# Patient Record
Sex: Female | Born: 1985 | Race: Asian | Hispanic: No | Marital: Married | Smoking: Never smoker
Health system: Southern US, Community
[De-identification: ages and names within clinical notes are randomized; demographics above are authoritative.]

## PROBLEM LIST (undated history)

## (undated) DIAGNOSIS — B1920 Unspecified viral hepatitis C without hepatic coma: Secondary | ICD-10-CM

---

## 2020-07-19 ENCOUNTER — Emergency Department: Payer: BLUE CROSS/BLUE SHIELD

## 2020-07-19 ENCOUNTER — Ambulatory Visit (INDEPENDENT_AMBULATORY_CARE_PROVIDER_SITE_OTHER)
Admission: EM | Admit: 2020-07-19 | Discharge: 2020-07-19 | Disposition: A | Discharge status: Home / Self Care | Payer: BLUE CROSS/BLUE SHIELD | Source: Home / Self Care | Attending: Emergency Medicine | Admitting: Emergency Medicine

## 2020-07-19 ENCOUNTER — Encounter: Payer: Self-pay | Admitting: Emergency Medicine

## 2020-07-19 ENCOUNTER — Ambulatory Visit (INDEPENDENT_AMBULATORY_CARE_PROVIDER_SITE_OTHER): Payer: BLUE CROSS/BLUE SHIELD

## 2020-07-19 ENCOUNTER — Other Ambulatory Visit: Payer: Self-pay

## 2020-07-19 DIAGNOSIS — R079 Chest pain, unspecified: Secondary | ICD-10-CM | POA: Insufficient documentation

## 2020-07-19 DIAGNOSIS — R0600 Dyspnea, unspecified: Secondary | ICD-10-CM | POA: Insufficient documentation

## 2020-07-19 DIAGNOSIS — D649 Anemia, unspecified: Secondary | ICD-10-CM | POA: Insufficient documentation

## 2020-07-19 LAB — BASIC METABOLIC PANEL
Anion gap: 9 (ref 5–15)
Anion gap: 11 (ref 5–15)
BUN: 11 mg/dL (ref 6–20)
BUN: 18 mg/dL (ref 6–20)
CO2: 24 mmol/L (ref 22–32)
CO2: 22 mmol/L (ref 22–32)
Calcium: 9.1 mg/dL (ref 8.9–10.3)
Calcium: 9.7 mg/dL (ref 8.9–10.3)
Chloride: 104 mmol/L (ref 98–111)
Chloride: 105 mmol/L (ref 98–111)
Creatinine, Ser: 0.58 mg/dL (ref 0.44–1.00)
Creatinine, Ser: 0.58 mg/dL (ref 0.44–1.00)
GFR calc Af Amer: >60 mL/min (ref >60)
GFR calc Af Amer: >60 mL/min (ref >60)
GFR calc non Af Amer: >60 mL/min (ref >60)
GFR calc non Af Amer: >60 mL/min (ref >60)
Glucose, Bld: 77 mg/dL (ref 70–99)
Glucose, Bld: 96 mg/dL (ref 70–99)
Potassium: 3.5 mmol/L (ref 3.5–5.1)
Potassium: 3.7 mmol/L (ref 3.5–5.1)
Sodium: 137 mmol/L (ref 135–145)
Sodium: 138 mmol/L (ref 135–145)

## 2020-07-19 LAB — CBC WITH DIFFERENTIAL/PLATELET
Abs Immature Granulocytes: 0.01 10*3/uL (ref 0.00–0.07)
Basophils Absolute: 0 10*3/uL (ref 0.0–0.1)
Basophils Relative: 0 %
Eosinophils Absolute: 0 10*3/uL (ref 0.0–0.5)
Eosinophils Relative: 1 %
HCT: 33.3 % — ABNORMAL LOW (ref 36.0–46.0)
Hemoglobin: 10.3 g/dL — ABNORMAL LOW (ref 12.0–15.0)
Immature Granulocytes: 0 %
Lymphocytes Relative: 33 %
Lymphs Abs: 1.3 10*3/uL (ref 0.7–4.0)
MCH: 22.9 pg — ABNORMAL LOW (ref 26.0–34.0)
MCHC: 30.9 g/dL (ref 30.0–36.0)
MCV: 74 fL — ABNORMAL LOW (ref 80.0–100.0)
Monocytes Absolute: 0.3 10*3/uL (ref 0.1–1.0)
Monocytes Relative: 7 %
Neutro Abs: 2.2 10*3/uL (ref 1.7–7.7)
Neutrophils Relative %: 59 %
Platelets: 223 10*3/uL (ref 150–400)
RBC: 4.5 MIL/uL (ref 3.87–5.11)
RDW: 15.5 % (ref 11.5–15.5)
WBC: 3.8 10*3/uL — ABNORMAL LOW (ref 4.0–10.5)
nRBC: 0 % (ref 0.0–0.2)

## 2020-07-19 LAB — CBC
HCT: 36.8 % (ref 36.0–46.0)
Hemoglobin: 11.2 g/dL — ABNORMAL LOW (ref 12.0–15.0)
MCH: 22.8 pg — ABNORMAL LOW (ref 26.0–34.0)
MCHC: 30.4 g/dL (ref 30.0–36.0)
MCV: 74.8 fL — ABNORMAL LOW (ref 80.0–100.0)
Platelets: 257 10*3/uL (ref 150–400)
RBC: 4.92 MIL/uL (ref 3.87–5.11)
RDW: 15.3 % (ref 11.5–15.5)
WBC: 6.6 10*3/uL (ref 4.0–10.5)
nRBC: 0 % (ref 0.0–0.2)

## 2020-07-19 LAB — TROPONIN I (HIGH SENSITIVITY)
Troponin I (High Sensitivity): <2 ng/L (ref <18)
Troponin I (High Sensitivity): <2 ng/L (ref <18)

## 2020-07-19 MED ORDER — FERROUS SULFATE 325 (65 FE) MG PO TABS
325.0000 mg | ORAL_TABLET | Freq: Every day | ORAL | 0 refills | Status: AC
Start: 2020-07-19 — End: ?

## 2020-07-19 NOTE — Discharge Instructions (Addendum)
Your chest x-ray, EKG and labs were reassuring.  Your hemoglobin is slightly low at 10.3.  However it is difficult to tell if this is new or not as there are no previous labs for comparison.  Start the iron pills.  This will build your blood back up and hopefully help with your shortness of breath.  Follow-up with a primary care provider as soon as you possibly can, see list below.  I have also ordered assistance for you to find a primary care provider go immediately to the emergency department if your chest pain returns, if you get worse, or for any other concerns.  Here is a list of primary care providers who are taking new patients:  Dr. Elizabeth Sauer [address] [place] [address] [phone]  [facility] [address] [place], [place] [address] [phone]  [facility] [place] [address] [place] [address]  [phone]  [facility] [address] [place], [place] [address] [phone]  [facility] [address] [place]  [phone] [place], [place] [address]  Here are clinics/ other resources who will see you if you do not have insurance. Some have certain criteria that you must meet. Call them and find out what they are:  [facility]: [address], [place], [place] [address] Phone: [phone] Hours: First and Third Saturdays of each Month, 9 a.m. - 1 p.m.  [facility]: [address], [address] [place], [place] [address] Phone: [phone] Hours: Tuesday, 4 p.m. - 8 p.m. Thursday, 1 p.m. - 8 p.m. Wednesday, 9 a.m. - [facility] [address], [place], [place] [address] Phone: [phone] Pharmacy Phone Number: [phone] Dental Phone Number: [phone] [facility] Insurance Help: [phone]  Dental Hours: Monday - Thursday, 8 a.m. - 6 p.m.  [facility] [address], [place], [place] [address] Phone: [phone] Pharmacy Phone Number:  [phone] [facility] Insurance Help: [phone]  [facility] [address] [place], [place] [address] Phone: [phone] Pharmacy Phone Number: [phone] [facility] Insurance Help: [phone]  [facility] [address] [place], [place] [address] Phone: [phone] [facility] Insurance Help: [phone]   [facility] [address] [place], [place] [address] Phone: [phone]  Go to www.goodrx.com to look up your medications. This will give you a list of where you can find your prescriptions at the most affordable prices. Or ask the pharmacist what the cash price is, or if they have any other discount programs available to help make your medication more affordable. This can be less expensive than what you would pay with insurance.

## 2020-07-19 NOTE — ED Triage Notes (Signed)
Pt to ED from home c/o SOB and chest pain since yesterday.  States SOB and chest pain with deep breaths, pain to mid chest. Denies pain today but still some SOB, denies cough.  Pt A&Ox4, chest rise even and unlabored, in NAD at this time.

## 2020-07-19 NOTE — ED Provider Notes (Signed)
HPI  SUBJECTIVE:  Tiffany Curry is a 34 y.o. female who presents with acute onset of sharp,  migratory, nonradiating right-sided chest pain present only with deep inspiration starting at 1900 last night. She reports constant shortness of breath described as "not being able to get a deep breath in" and some dizziness. She states that the right-sided chest pain has resolved as of this morning. She has had many similar episodes over the past few years, states that usually happens before going to bed. She states this episode is different because the chest pain/shortness of breath usually resolves on its own after several hours. There is no positional or exertional component to it. No fevers, cough, wheezing, trauma to the chest, change in physical activity. No recent illness palpitations, syncope, waterbrash. No burning chest pain, belching. No nausea, diaphoresis, no chest pressure or heaviness. No dyspnea on exertion, lower extremity edema, calf pain or swelling, hemoptysis, exogenous estrogen, surgery in the past 4 weeks, recent immobilization. She reports approximately 5 pound weight gain over the past month. No nocturia, PND, orthopnea. She tried rest. Symptoms are better with lying down flat, worse when she "feels hot". Past medical history negative for anxiety, GERD, CHF, PE, DVT, cancer, pulmonary disease, smoking, hypertension, myocarditis, Covid. She has a history of hepatitis B, gestational diabetes. Family history significant for a family member with an MI at age 72. She states that he had similar chest pains and shortness of breath and is worried that she may be having a heart attack. No family history of PE, DVT, lung cancer. PE: 7/26. Denies possibility of being pregnant. PMD: None. All history obtained through language line.    History reviewed. No pertinent past medical history.  History reviewed. No pertinent surgical history.  History reviewed. No pertinent family history.  Social History    Tobacco Use  . Smoking status: Never Smoker  . Smokeless tobacco: Never Used  Vaping Use  . Vaping Use: Never used  Substance Use Topics  . Alcohol use: Never  . Drug use: Never    No current facility-administered medications for this encounter.  Current Outpatient Medications:  .  ferrous sulfate 325 (65 FE) MG tablet, Take 1 tablet (325 mg total) by mouth daily., Disp: 30 tablet, Rfl: 0  No Known Allergies   ROS  As noted in HPI.   Physical Exam  BP (!) 94/57 (BP Location: Right Arm)   Pulse 90   Temp (!) 97.4 F (36.3 C) (Oral)   Resp 18   Ht 5' 0.24" (1.53 m)   LMP 06/27/2020   SpO2 100%   Constitutional: Well developed, well nourished, appears anxious Eyes: PERRL, EOMI, conjunctiva normal bilaterally HENT: Normocephalic, atraumatic,mucus membranes moist Respiratory: Clear to auscultation bilaterally, no rales, no wheezing, no rhonchi.  No chest wall tenderness Cardiovascular: Normal rate and rhythm, no murmurs, no gallops, no rubs.  No JVD GI: Soft, nondistended, normal bowel sounds, nontender, no rebound, no guarding.  No hepatomegaly skin: No rash, skin intact Musculoskeletal: Calves symmetric, nontender, no edema, Neurologic: Alert & oriented x 3, CN III-XII grossly intact, no motor deficits, sensation grossly intact Psychiatric: Speech and behavior appropriate   ED Course   Medications - No data to display  Orders Placed This Encounter  Procedures  . DG Chest 2 View    Standing Status:   Standing    Number of Occurrences:   1    Order Specific Question:   Reason for Exam (SYMPTOM  OR DIAGNOSIS REQUIRED)  Answer:   SOB  . CBC with Differential    Standing Status:   Standing    Number of Occurrences:   1  . Basic metabolic panel    Standing Status:   Standing    Number of Occurrences:   1  . ED EKG    Standing Status:   Standing    Number of Occurrences:   1    Order Specific Question:   Reason for Exam    Answer:   Shortness of breath    Results for orders placed or performed during the hospital encounter of 07/19/20 (from the past 24 hour(s))  CBC with Differential     Status: Abnormal   Collection Time: 07/19/20  1:35 PM  Result Value Ref Range   WBC 3.8 (L) 4.0 - 10.5 K/uL   RBC 4.50 3.87 - 5.11 MIL/uL   Hemoglobin 10.3 (L) 12.0 - 15.0 g/dL   HCT 98.3 (L) 36 - 46 %   MCV 74.0 (L) 80.0 - 100.0 fL   MCH 22.9 (L) 26.0 - 34.0 pg   MCHC 30.9 30.0 - 36.0 g/dL   RDW 38.2 50.5 - 39.7 %   Platelets 223 150 - 400 K/uL   nRBC 0.0 0.0 - 0.2 %   Neutrophils Relative % 59 %   Neutro Abs 2.2 1.7 - 7.7 K/uL   Lymphocytes Relative 33 %   Lymphs Abs 1.3 0.7 - 4.0 K/uL   Monocytes Relative 7 %   Monocytes Absolute 0.3 0 - 1 K/uL   Eosinophils Relative 1 %   Eosinophils Absolute 0.0 0 - 0 K/uL   Basophils Relative 0 %   Basophils Absolute 0.0 0 - 0 K/uL   Immature Granulocytes 0 %   Abs Immature Granulocytes 0.01 0.00 - 0.07 K/uL  Basic metabolic panel     Status: None   Collection Time: 07/19/20  1:35 PM  Result Value Ref Range   Sodium 137 135 - 145 mmol/L   Potassium 3.5 3.5 - 5.1 mmol/L   Chloride 104 98 - 111 mmol/L   CO2 24 22 - 32 mmol/L   Glucose, Bld 77 70 - 99 mg/dL   BUN 11 6 - 20 mg/dL   Creatinine, Ser 6.73 0.44 - 1.00 mg/dL   Calcium 9.1 8.9 - 41.9 mg/dL   GFR calc non Af Amer >60 >60 mL/min   GFR calc Af Amer >60 >60 mL/min   Anion gap 9 5 - 15  Troponin I (High Sensitivity)     Status: None   Collection Time: 07/19/20  1:35 PM  Result Value Ref Range   Troponin I (High Sensitivity) <2 <18 ng/L   DG Chest 2 View  Result Date: 07/19/2020 CLINICAL DATA:  Short of breath EXAM: CHEST - 2 VIEW COMPARISON:  None. FINDINGS: The heart size and mediastinal contours are within normal limits. Both lungs are clear. The visualized skeletal structures are unremarkable. IMPRESSION: No active cardiopulmonary disease. Electronically Signed   By: Marlan Palau M.D.   On: 07/19/2020 14:13    ED Clinical  Impression  1. Dyspnea, unspecified type   2. Anemia, unspecified type   3. Chest pain, unspecified type      ED Assessment/Plan  In the differential is ACS, pneumonia, pneumothorax, myocarditis, pericarditis, anemia.  Patient is PERC negative, deferring D-dimer.  Think PE less likely in the absence of tachycardia, hypoxia. Anxiety also in the differential.  CBC, BMP, troponin, chest x-ray.    EKG: Normal sinus  rhythm, rate 77. Normal axis, normal intervals. No hypertrophy. No previous EKG for comparison  Reviewed imaging independently.  Normal.  No pneumothorax, effusion.  See radiology report for full details.  She is anemic at 10.3, no previous labs for comparison.  Do not know if this is acute or not.  troponin negative, BMP normal.  Given the negative troponin, reassuring EKG and chest x-ray, unsure as to the cause of her symptoms, suspect anxiety.  Anemia in the differential.  We will start her on iron pills.  Will provide primary care list for her to follow-up with.  Will also order assistance with obtaining primary care.  Strict ER return precautions given.  Spent 25 minutes obtaining history and performing physical on patient. Spent an additional 25 minutes using the language line explaining findings, medical decision-making, treatment plan and plan for follow-up with patient. Answered all questions.  Discussed labs, imaging, MDM, treatment plan, and plan for follow-up with patient Discussed sn/sx that should prompt return to the ED. patient agrees with plan.   Meds ordered this encounter  Medications  . ferrous sulfate 325 (65 FE) MG tablet    Sig: Take 1 tablet (325 mg total) by mouth daily.    Dispense:  30 tablet    Refill:  0    *This clinic note was created using Scientist, clinical (histocompatibility and immunogenetics). Therefore, there may be occasional mistakes despite careful proofreading.  ?   Domenick Gong, MD 07/19/20 [phone]

## 2020-07-19 NOTE — ED Triage Notes (Signed)
Patient c/o pain when taking a deep breath that started yesterday. She states she has had this before and it resolved but this hasn't gone away since yesterday.

## 2020-07-20 ENCOUNTER — Emergency Department
Admission: EM | Admit: 2020-07-20 | Discharge: 2020-07-20 | Disposition: A | Discharge status: Home / Self Care | Payer: BLUE CROSS/BLUE SHIELD | Attending: Emergency Medicine | Admitting: Emergency Medicine

## 2020-07-20 DIAGNOSIS — R079 Chest pain, unspecified: Secondary | ICD-10-CM

## 2020-07-20 HISTORY — DX: Unspecified viral hepatitis C without hepatic coma: B19.20

## 2020-07-20 LAB — HEPATIC FUNCTION PANEL
ALT: 16 U/L (ref 0–44)
AST: 22 U/L (ref 15–41)
Albumin: 5.1 g/dL — ABNORMAL HIGH (ref 3.5–5.0)
Alkaline Phosphatase: 40 U/L (ref 38–126)
Bilirubin, Direct: <0.1 mg/dL (ref 0.0–0.2)
Total Bilirubin: 0.7 mg/dL (ref 0.3–1.2)
Total Protein: 8.8 g/dL — ABNORMAL HIGH (ref 6.5–8.1)

## 2020-07-20 LAB — LIPASE, BLOOD: Lipase: 36 U/L (ref 11–51)

## 2020-07-20 LAB — FIBRIN DERIVATIVES D-DIMER (ARMC ONLY): Fibrin derivatives D-dimer (ARMC): 217.54 ng/mL (FEU) (ref 0.00–499.00)

## 2020-07-20 LAB — TROPONIN I (HIGH SENSITIVITY): Troponin I (High Sensitivity): <2 ng/L (ref <18)

## 2020-07-20 NOTE — Discharge Instructions (Addendum)
Please return to the ED with any worsening symptoms, especially in combination with fevers, passing out or throwing up.

## 2020-07-20 NOTE — ED Notes (Signed)
This RN attempted IV access, no success.

## 2020-07-20 NOTE — ED Notes (Signed)
IV team at bedside at this time. 

## 2020-07-20 NOTE — ED Provider Notes (Signed)
[facility] Emergency Department Provider Note  ____________________________________________   First MD Initiated Contact with Patient 07/20/20 [phone]     (approximate)  I have reviewed the triage vital signs and the nursing notes.   HISTORY  Chief Complaint Shortness of Breath and Chest Pain    HPI Tiffany Curry is a 34 y.o. female with history of hepatitis C and no other chronic medical conditions presents to the emergency department secondary to acute onset of intermittent nonradiating right upper chest pain which patient states began 7 PM on 07/18/2020.  Patient states that the pain is worse with deep inspiration.  Patient states that she is experienced this in the past and that it usually resolves on its own however this continues to recur.  Patient was seen by urgent care on 07/19/2020 where laboratory data revealed a normal troponin, normal EKG chest x-ray.  Patient states that she returned tonight secondary to return of the chest discomfort that is worse with deep inspiration as well as bilateral hand and facial numbness.  Patient states that she was breathing rapidly at the time that the numbness occurred.  Patient states that numbness is since resolved.  Patient denies any headache no weakness or visual changes.        Past Medical History:  Diagnosis Date  . Hepatitis C     There are no problems to display for this patient.   No past surgical history on file.  Prior to Admission medications   Medication Sig Start Date End Date Taking? Authorizing Provider  ferrous sulfate 325 (65 FE) MG tablet Take 1 tablet (325 mg total) by mouth daily. 07/19/20   Domenick Gong, MD    Allergies Patient has no known allergies.  No family history on file.  Social History Social History   Tobacco Use  . Smoking status: Never Smoker  . Smokeless tobacco: Never Used  Vaping Use  . Vaping Use: Never used  Substance Use Topics  . Alcohol use: Never  .  Drug use: Never    Review of Systems Constitutional: No fever/chills Eyes: No visual changes. ENT: No sore throat. Cardiovascular: Positive for chest pain. Respiratory: Denies shortness of breath. Gastrointestinal: No abdominal pain.  No nausea, no vomiting.  No diarrhea.  No constipation. Genitourinary: Negative for dysuria. Musculoskeletal: Negative for neck pain.  Negative for back pain. Integumentary: Negative for rash. Neurological: Negative for headaches, focal weakness.  Positive for bilateral hand and facial numbness now resolved  ____________________________________________   PHYSICAL EXAM:  VITAL SIGNS: ED Triage Vitals  Enc Vitals Group     BP 07/19/20 2059 102/71     Pulse Rate 07/19/20 2059 86     Resp 07/19/20 2059 16     Temp 07/19/20 2059 98.6 F (37 C)     Temp Source 07/19/20 2059 Oral     SpO2 07/19/20 2059 100 %     Weight 07/19/20 2057 68 kg (150 lb)     Height 07/19/20 2057 1.52 m (4' 11.84")     Head Circumference --      Peak Flow --      Pain Score 07/19/20 2056 1     Pain Loc --      Pain Edu? --      Excl. in GC? --     Constitutional: Alert and oriented.  Eyes: Conjunctivae are normal.  Head: Atraumatic. Mouth/Throat: Patient is wearing a mask. Neck: No stridor.  No meningeal signs.   Cardiovascular: Normal rate,  regular rhythm. Good peripheral circulation. Grossly normal heart sounds. Respiratory: Normal respiratory effort.  No retractions. Gastrointestinal: Soft and nontender. No distention.  Musculoskeletal: No lower extremity tenderness nor edema. No gross deformities of extremities. Neurologic:  Normal speech and language. No gross focal neurologic deficits are appreciated.  Skin:  Skin is warm, dry and intact. Psychiatric: Mood and affect are normal. Speech and behavior are normal.  ____________________________________________   LABS (all labs ordered are listed, but only abnormal results are displayed)  Labs Reviewed  CBC -  Abnormal; Notable for the following components:      Result Value   Hemoglobin 11.2 (*)    MCV 74.8 (*)    MCH 22.8 (*)    All other components within normal limits  HEPATIC FUNCTION PANEL - Abnormal; Notable for the following components:   Total Protein 8.8 (*)    Albumin 5.1 (*)    All other components within normal limits  BASIC METABOLIC PANEL  LIPASE, BLOOD  FIBRIN DERIVATIVES D-DIMER ([facility] ONLY)  POC URINE PREG, ED  TROPONIN I (HIGH SENSITIVITY)  TROPONIN I (HIGH SENSITIVITY)   ____________________________________________  EKG  ED ECG REPORT I, Terrell N Emonie Espericueta, the attending physician, personally viewed and interpreted this ECG.   Date: 07/20/2020  EKG Time: 9:05 PM  Rate: 85  Rhythm: Normal sinus rhythm  Axis: Normal  Intervals: Normal  ST&T Change: None  ____________________________________________  RADIOLOGY I, Sterling N Kameryn Tisdel, personally viewed and evaluated these images (plain radiographs) as part of my medical decision making, as well as reviewing the written report by the radiologist.  ED MD interpretation: No evidence of acute cardiopulmonary disease on chest x-ray per radiologist.  Official radiology report(s): DG Chest 2 View  Result Date: 07/19/2020 CLINICAL DATA:  Chest pain. Additional history provided: Patient reports shortness of breath and chest pain since yesterday, reports shortness of breath and chest pain when taking deep breaths, pain in mid chest. EXAM: CHEST - 2 VIEW COMPARISON:  Chest radiographs performed earlier the same day 07/19/2020. FINDINGS: Heart size within normal limits. No appreciable airspace consolidation or pulmonary edema. No evidence of pleural effusion or pneumothorax. No acute bony abnormality identified. Thoracic dextrocurvature. IMPRESSION: No evidence of active cardiopulmonary disease. Thoracic dextrocurvature. Electronically Signed   By: Jackey Loge DO   On: 07/19/2020 21:26   DG Chest 2 View  Result Date:  07/19/2020 CLINICAL DATA:  Short of breath EXAM: CHEST - 2 VIEW COMPARISON:  None. FINDINGS: The heart size and mediastinal contours are within normal limits. Both lungs are clear. The visualized skeletal structures are unremarkable. IMPRESSION: No active cardiopulmonary disease. Electronically Signed   By: Marlan Palau M.D.   On: 07/19/2020 14:13      Procedures   ____________________________________________   INITIAL IMPRESSION / MDM / ASSESSMENT AND PLAN / ED COURSE  As part of my medical decision making, I reviewed the following data within the electronic MEDICAL RECORD NUMBER  34 year old female presented with above-stated history and physical exam a differential diagnosis including but not limited to ACS, low risk probability of pulmonary emboli, hyperventilation.  EKG revealed no evidence of ischemia/infarction or arrhythmia.  Laboratory data revealed negative high-sensitivity troponin x3.  Plan to obtain D-dimer and if negative patient will be discharged home with cardiology outpatient follow-up.  Patient's care transferred to Dr. Katrinka Blazing ____________________________________________  FINAL CLINICAL IMPRESSION(S) / ED DIAGNOSES  Final diagnoses:  Chest pain, unspecified type     MEDICATIONS GIVEN DURING THIS VISIT:  Medications - No data  to display   ED Discharge Orders    None      *Please note:  Ivori Storr was evaluated in Emergency Department on 07/20/2020 for the symptoms described in the history of present illness. She was evaluated in the context of the global COVID-19 pandemic, which necessitated consideration that the patient might be at risk for infection with the SARS-CoV-2 virus that causes COVID-19. Institutional protocols and algorithms that pertain to the evaluation of patients at risk for COVID-19 are in a state of rapid change based on information released by regulatory bodies including the CDC and federal and state organizations. These policies and algorithms were  followed during the patient's care in the ED.  Some ED evaluations and interventions may be delayed as a result of limited staffing during and after the pandemic.*  Note:  This document was prepared using Dragon voice recognition software and may include unintentional dictation errors.   Darci Current, MD 07/20/20 [phone]

## 2020-08-02 ENCOUNTER — Encounter: Payer: Self-pay | Admitting: Emergency Medicine

## 2020-08-02 ENCOUNTER — Other Ambulatory Visit: Payer: Self-pay

## 2020-08-02 ENCOUNTER — Ambulatory Visit
Admission: EM | Admit: 2020-08-02 | Discharge: 2020-08-02 | Disposition: A | Discharge status: Home / Self Care | Payer: BLUE CROSS/BLUE SHIELD | Attending: Internal Medicine | Admitting: Internal Medicine

## 2020-08-02 DIAGNOSIS — R0602 Shortness of breath: Secondary | ICD-10-CM | POA: Insufficient documentation

## 2020-08-02 LAB — TSH: TSH: 3.066 u[IU]/mL (ref 0.350–4.500)

## 2020-08-02 MED ORDER — ALBUTEROL SULFATE HFA 108 (90 BASE) MCG/ACT IN AERS: 1.0000 | INHALATION_SPRAY | Freq: Four times a day (QID) | RESPIRATORY_TRACT | Status: DC | PRN

## 2020-08-02 MED ORDER — ALBUTEROL SULFATE HFA 108 (90 BASE) MCG/ACT IN AERS
2.0000 | INHALATION_SPRAY | Freq: Once | RESPIRATORY_TRACT | Status: AC
  Administered 2020-08-02: 2 via RESPIRATORY_TRACT

## 2020-08-02 MED ORDER — HYDROXYZINE HCL 25 MG PO TABS: 25.0000 mg | ORAL_TABLET | Freq: Three times a day (TID) | ORAL | 0 refills | Status: AC | PRN

## 2020-08-02 MED ORDER — ALBUTEROL SULFATE HFA 108 (90 BASE) MCG/ACT IN AERS: 1.0000 | INHALATION_SPRAY | Freq: Four times a day (QID) | RESPIRATORY_TRACT | 0 refills | Status: AC | PRN

## 2020-08-02 NOTE — ED Provider Notes (Addendum)
[facility]    CSN: [id number] Arrival date & time: 08/02/20  1405      History   Chief Complaint Chief Complaint  Patient presents with  . Shortness of Breath    HPI Tiffany Curry is a 34 y.o. female comes to the urgent care with recurrent shortness of breath over the past couple of weeks.  Patient was seen in the emergency department about 2 weeks ago.  Her work-up was unremarkable.  Chest x-ray was unremarkable, EKG was unremarkable as well.  D-dimer was normal at that time.  Patient comes back with an episode of shortness of breath which started earlier today.  Shortness of breath is associated with some chest tightness.  Feels better when she lays down and worsens when she is ambulatory.  No cough.  No swelling.  No recent travel.  No fever or chills.  No cough or sputum production.  No weight loss or night sweats.  Patient works in [employer] and she says sometimes when she is in [employer] and the room is too warm and humid etc. for shortness of breath.  She has some numbness in the hands and feet associated with the shortness of breath.  She denies palpitations.  She admits to having some dizziness with the shortness of breath.Marland Kitchen   HPI  Past Medical History:  Diagnosis Date  . Hepatitis C     There are no problems to display for this patient.   History reviewed. No pertinent surgical history.  OB History   No obstetric history on file.      Home Medications    Prior to Admission medications   Medication Sig Start Date End Date Taking? Authorizing Provider  ferrous sulfate 325 (65 FE) MG tablet Take 1 tablet (325 mg total) by mouth daily. 07/19/20  Yes Domenick Gong, MD  albuterol (VENTOLIN HFA) 108 (90 Base) MCG/ACT inhaler Inhale 1-2 puffs into the lungs every 6 (six) hours as needed for wheezing or shortness of breath. 08/02/20   Merrilee Jansky, MD  hydrOXYzine (ATARAX/VISTARIL) 25 MG tablet Take 1 tablet (25 mg total) by mouth every 8 (eight)  hours as needed for anxiety. 08/02/20   Pheobe Sandiford, Britta Mccreedy, MD    Family History History reviewed. No pertinent family history.  Social History Social History   Tobacco Use  . Smoking status: Never Smoker  . Smokeless tobacco: Never Used  Vaping Use  . Vaping Use: Never used  Substance Use Topics  . Alcohol use: Never  . Drug use: Never     Allergies   Patient has no known allergies.   Review of Systems Review of Systems  Constitutional: Negative.   Respiratory: Positive for chest tightness and shortness of breath. Negative for wheezing.   Cardiovascular: Negative for chest pain and palpitations.  Gastrointestinal: Negative.   Musculoskeletal: Negative.   Neurological: Negative.      Physical Exam Triage Vital Signs ED Triage Vitals  Enc Vitals Group     BP 08/02/20 1441 94/64     Pulse Rate 08/02/20 1441 64     Resp 08/02/20 1441 (!) 22     Temp 08/02/20 1441 98 F (36.7 C)     Temp Source 08/02/20 1441 Oral     SpO2 08/02/20 1441 100 %     Weight --      Height --      Head Circumference --      Peak Flow --      Pain  Score 08/02/20 1435 0     Pain Loc --      Pain Edu? --      Excl. in GC? --    No data found.  Updated Vital Signs BP 94/64 (BP Location: Left Arm)   Pulse 64   Temp 98 F (36.7 C) (Oral)   Resp (!) 22   LMP 07/28/2020 (Approximate)   SpO2 100%   Visual Acuity Right Eye Distance:   Left Eye Distance:   Bilateral Distance:    Right Eye Near:   Left Eye Near:    Bilateral Near:     Physical Exam Vitals reviewed.  Constitutional:      General: She is in acute distress.     Appearance: She is not ill-appearing.  Cardiovascular:     Rate and Rhythm: Normal rate and regular rhythm.  Pulmonary:     Breath sounds: No decreased breath sounds, wheezing, rhonchi or rales.  Abdominal:     Palpations: Abdomen is soft.  Musculoskeletal:        General: Normal range of motion.  Skin:    General: Skin is warm.  Neurological:      Mental Status: She is alert.      UC Treatments / Results  Labs (all labs ordered are listed, but only abnormal results are displayed) Labs Reviewed  TSH    EKG   Radiology No results found.  Procedures Procedures (including critical care time)  Medications Ordered in UC Medications  albuterol (VENTOLIN HFA) 108 (90 Base) MCG/ACT inhaler 2 puff (2 puffs Inhalation Given 08/02/20 1612)    Initial Impression / Assessment and Plan / UC Course  I have reviewed the triage vital signs and the nursing notes.  Pertinent labs & imaging results that were available during my care of the patient were reviewed by me and considered in my medical decision making (see chart for details).     1.  Shortness of breath: This may be secondary to bronchospasm from environmental exposures or panic attacks Albuterol inhaler as needed-patient was given bronchodilator treatment here by way of albuterol inhaler and it appeared to have helped.  Shortness of breath. EKG is negative for acute ST-T wave changes TSH level Hydroxyzine as needed for anxiety If symptoms persist patient may need further testing i.e. provocation lung function test Return precautions given. Final Clinical Impressions(s) / UC Diagnoses   Final diagnoses:  SOB (shortness of breath)   Discharge Instructions   None    ED Prescriptions    Medication Sig Dispense Auth. Provider   albuterol (VENTOLIN HFA) 108 (90 Base) MCG/ACT inhaler Inhale 1-2 puffs into the lungs every 6 (six) hours as needed for wheezing or shortness of breath. 18 g Merrilee Jansky, MD   hydrOXYzine (ATARAX/VISTARIL) 25 MG tablet Take 1 tablet (25 mg total) by mouth every 8 (eight) hours as needed for anxiety. 12 tablet Rhylan Kagel, Britta Mccreedy, MD     PDMP not reviewed this encounter.   Merrilee Jansky, MD 08/02/20 1631    Merrilee Jansky, MD 08/02/20 [phone]

## 2020-08-02 NOTE — ED Triage Notes (Signed)
Pt c/o shortness of breath, dizziness and numbness. Started about 2 weeks ago. She has been seen at the ER and had a full cardiac work up.

## 2021-04-17 IMAGING — CR DG CHEST 2V
2 series · 2 of 2 positions shown · non-contrast
Comparison: None.

CLINICAL DATA: Short of breath

EXAM:
CHEST - 2 VIEW

[chest pa]
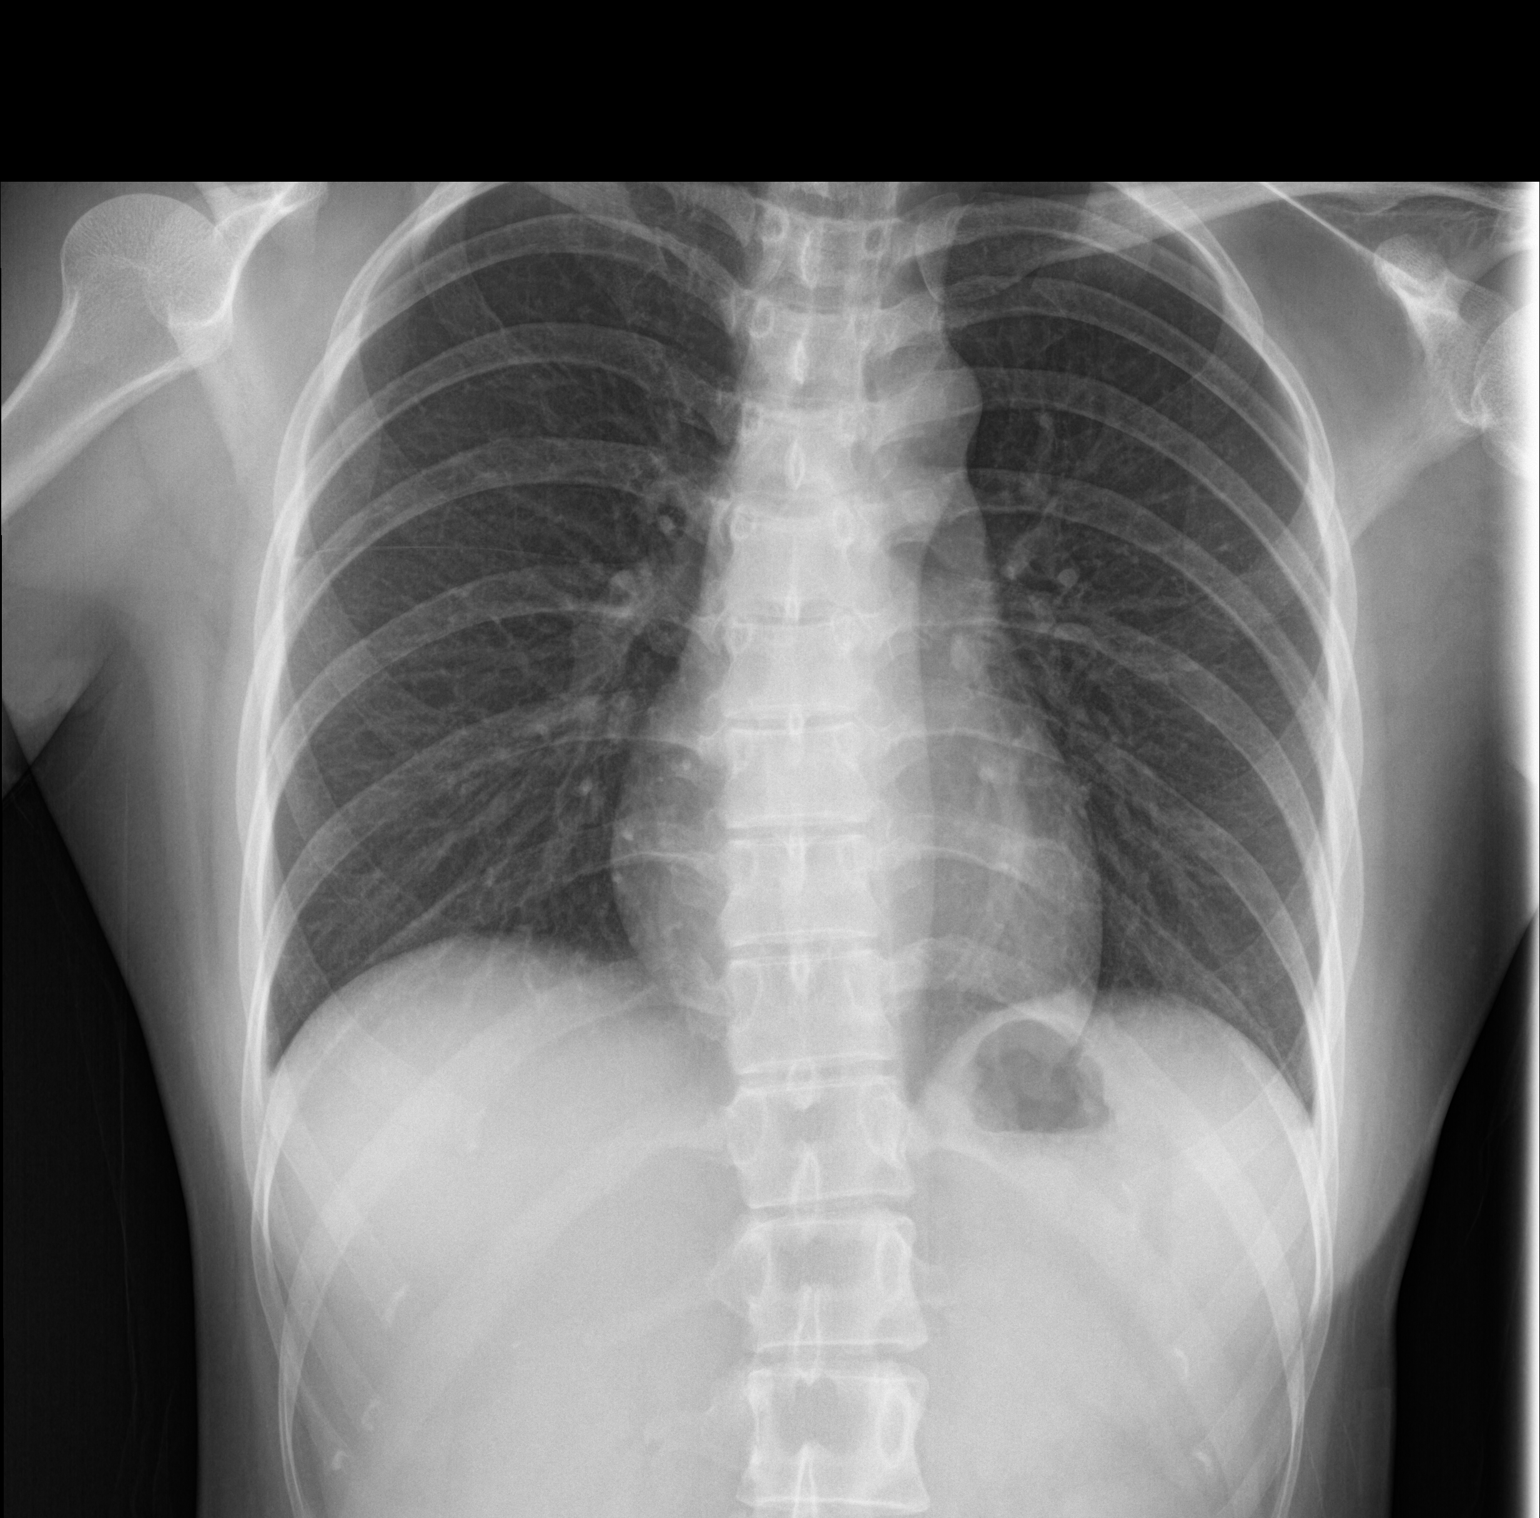

[chest lat]
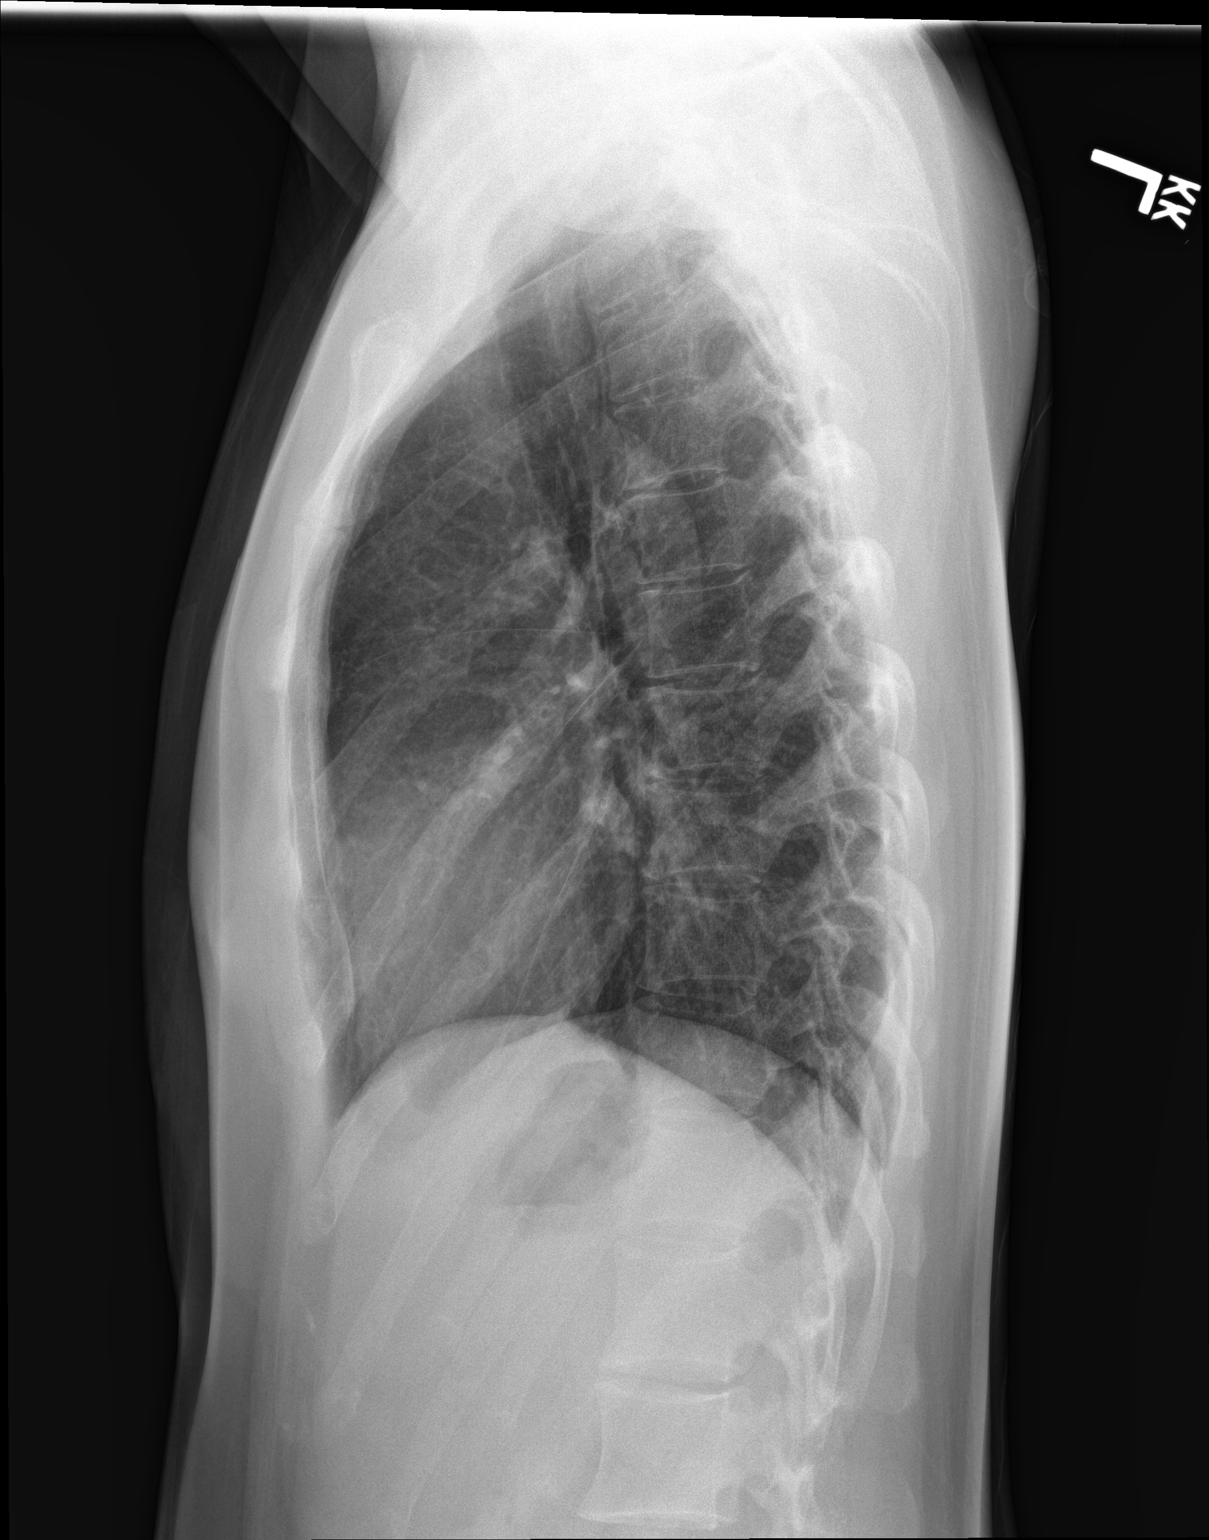

[2 of 2 positions shown; findings below may reference images not displayed]

FINDINGS: The heart size and mediastinal contours are within normal limits.
Both lungs are clear. The visualized skeletal structures are
unremarkable.
IMPRESSION: No active cardiopulmonary disease.
